# Patient Record
Sex: Male | Born: 1995 | Race: White | Hispanic: No | Marital: Single
Health system: Southern US, Community
[De-identification: ages and names within clinical notes are randomized; demographics above are authoritative.]

## PROBLEM LIST (undated history)

## (undated) DIAGNOSIS — J45909 Unspecified asthma, uncomplicated: Secondary | ICD-10-CM

## (undated) DIAGNOSIS — R569 Unspecified convulsions: Secondary | ICD-10-CM

---

## 2014-11-27 HISTORY — PX: OTHER SURGICAL HISTORY: SHX169

## 2019-01-06 ENCOUNTER — Emergency Department (HOSPITAL_COMMUNITY)
Admission: EM | Admit: 2019-01-06 | Discharge: 2019-01-07 | Disposition: A | Discharge status: Home / Self Care | Payer: PRIVATE HEALTH INSURANCE | Attending: Emergency Medicine | Admitting: Emergency Medicine

## 2019-01-06 ENCOUNTER — Emergency Department (HOSPITAL_COMMUNITY): Payer: PRIVATE HEALTH INSURANCE

## 2019-01-06 ENCOUNTER — Encounter (HOSPITAL_COMMUNITY): Payer: Self-pay | Admitting: Emergency Medicine

## 2019-01-06 DIAGNOSIS — L03213 Periorbital cellulitis: Secondary | ICD-10-CM | POA: Diagnosis not present

## 2019-01-06 DIAGNOSIS — H5789 Other specified disorders of eye and adnexa: Secondary | ICD-10-CM | POA: Diagnosis present

## 2019-01-06 LAB — CBC WITH DIFFERENTIAL/PLATELET
Abs Immature Granulocytes: 0.04 10*3/uL (ref 0.00–0.07)
Basophils Absolute: 0.1 10*3/uL (ref 0.0–0.1)
Basophils Relative: 0 %
Eosinophils Absolute: 0.1 10*3/uL (ref 0.0–0.5)
Eosinophils Relative: 1 %
HCT: 50.9 % (ref 39.0–52.0)
Hemoglobin: 16.9 g/dL (ref 13.0–17.0)
Immature Granulocytes: 0 %
LYMPHS ABS: 1.5 10*3/uL (ref 0.7–4.0)
LYMPHS PCT: 11 %
MCH: 28.4 pg (ref 26.0–34.0)
MCHC: 33.2 g/dL (ref 30.0–36.0)
MCV: 85.5 fL (ref 80.0–100.0)
Monocytes Absolute: 1 10*3/uL (ref 0.1–1.0)
Monocytes Relative: 7 %
Neutro Abs: 10.9 10*3/uL — ABNORMAL HIGH (ref 1.7–7.7)
Neutrophils Relative %: 81 %
PLATELETS: 264 10*3/uL (ref 150–400)
RBC: 5.95 MIL/uL — ABNORMAL HIGH (ref 4.22–5.81)
RDW: 11.9 % (ref 11.5–15.5)
WBC: 13.6 10*3/uL — ABNORMAL HIGH (ref 4.0–10.5)
nRBC: 0 % (ref 0.0–0.2)

## 2019-01-06 LAB — BASIC METABOLIC PANEL
Anion gap: 9 (ref 5–15)
BUN: <5 mg/dL — ABNORMAL LOW (ref 6–20)
CO2: 27 mmol/L (ref 22–32)
Calcium: 9.7 mg/dL (ref 8.9–10.3)
Chloride: 105 mmol/L (ref 98–111)
Creatinine, Ser: 1.01 mg/dL (ref 0.61–1.24)
GFR calc Af Amer: >60 mL/min (ref >60)
GFR calc non Af Amer: >60 mL/min (ref >60)
GLUCOSE: 114 mg/dL — AB (ref 70–99)
Potassium: 3.7 mmol/L (ref 3.5–5.1)
Sodium: 141 mmol/L (ref 135–145)

## 2019-01-06 MED ORDER — AMOXICILLIN-POT CLAVULANATE 875-125 MG PO TABS
1.0000 | ORAL_TABLET | Freq: Once | ORAL | Status: AC
  Administered 2019-01-06: 1 via ORAL
  Filled 2019-01-06: qty 1

## 2019-01-06 MED ORDER — AMOXICILLIN-POT CLAVULANATE 875-125 MG PO TABS: 1.0000 | ORAL_TABLET | Freq: Two times a day (BID) | ORAL | 0 refills | Status: AC

## 2019-01-06 MED ORDER — IOHEXOL 300 MG/ML  SOLN
75.0000 mL | Freq: Once | INTRAMUSCULAR | Status: AC | PRN
  Administered 2019-01-06: 75 mL via INTRAVENOUS

## 2019-01-06 MED ORDER — CLINDAMYCIN HCL 150 MG PO CAPS
300.0000 mg | ORAL_CAPSULE | Freq: Once | ORAL | Status: AC
  Administered 2019-01-06: 300 mg via ORAL
  Filled 2019-01-06: qty 2

## 2019-01-06 MED ORDER — TETRACAINE HCL 0.5 % OP SOLN
2.0000 [drp] | Freq: Once | OPHTHALMIC | Status: AC
  Administered 2019-01-06: 2 [drp] via OPHTHALMIC
  Filled 2019-01-06: qty 4

## 2019-01-06 MED ORDER — FLUORESCEIN SODIUM 1 MG OP STRP
1.0000 | ORAL_STRIP | Freq: Once | OPHTHALMIC | Status: AC
  Administered 2019-01-06: 1 via OPHTHALMIC
  Filled 2019-01-06: qty 1

## 2019-01-06 MED ORDER — CLINDAMYCIN HCL 300 MG PO CAPS: 300.0000 mg | ORAL_CAPSULE | Freq: Three times a day (TID) | ORAL | 0 refills | Status: AC

## 2019-01-06 NOTE — Discharge Instructions (Signed)
You can take Tylenol or Ibuprofen as directed for pain. You can alternate Tylenol and Ibuprofen every 4 hours. If you take Tylenol at 1pm, then you can take Ibuprofen at 5pm. Then you can take Tylenol again at 9pm.   Take antibiotics as directed. Please take all of your antibiotics until finished.  As we discussed, it is very important for you to follow-up with eye doctor.  Please call Dr. Benard Rink office tomorrow and arrange for an appointment.  Additionally, given your extensive history, it is important that you follow-up with the ear nose and throat doctor.  Please call Dr. Thurmon Fair office tomorrow and arrange for an appointment.  If you have worsening redness or swelling that begins to spread, your pain in your eye gets worse, you start running fever, you have no improvement after 2 days of antibiotics, return the emergency department immediately.
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
## 2019-01-06 NOTE — ED Triage Notes (Signed)
Pt reports that he woke up with redness, swelling, and drainage to his right eye. Denies any occular trauma.

## 2019-01-06 NOTE — ED Notes (Signed)
Visual Acuity L Eye- 10/12.5 R Eye-10/20 Both- 10/10

## 2019-01-06 NOTE — ED Notes (Signed)
No answer in waiting room 

## 2019-02-24 ENCOUNTER — Encounter (HOSPITAL_COMMUNITY): Payer: Self-pay

## 2019-02-24 ENCOUNTER — Emergency Department (HOSPITAL_COMMUNITY)
Admission: EM | Admit: 2019-02-24 | Discharge: 2019-02-25 | Disposition: A | Discharge status: Home / Self Care | Payer: PRIVATE HEALTH INSURANCE | Attending: Emergency Medicine | Admitting: Emergency Medicine

## 2019-02-24 ENCOUNTER — Other Ambulatory Visit: Payer: Self-pay

## 2019-02-24 ENCOUNTER — Emergency Department (HOSPITAL_COMMUNITY): Payer: PRIVATE HEALTH INSURANCE

## 2019-02-24 DIAGNOSIS — S0101XA Laceration without foreign body of scalp, initial encounter: Secondary | ICD-10-CM | POA: Insufficient documentation

## 2019-02-24 DIAGNOSIS — R569 Unspecified convulsions: Secondary | ICD-10-CM | POA: Diagnosis not present

## 2019-02-24 DIAGNOSIS — Y998 Other external cause status: Secondary | ICD-10-CM | POA: Diagnosis not present

## 2019-02-24 DIAGNOSIS — J45909 Unspecified asthma, uncomplicated: Secondary | ICD-10-CM | POA: Diagnosis not present

## 2019-02-24 DIAGNOSIS — Z23 Encounter for immunization: Secondary | ICD-10-CM | POA: Diagnosis not present

## 2019-02-24 DIAGNOSIS — Y92410 Unspecified street and highway as the place of occurrence of the external cause: Secondary | ICD-10-CM | POA: Insufficient documentation

## 2019-02-24 DIAGNOSIS — Y9389 Activity, other specified: Secondary | ICD-10-CM | POA: Diagnosis not present

## 2019-02-24 DIAGNOSIS — Y33XXXA Other specified events, undetermined intent, initial encounter: Secondary | ICD-10-CM | POA: Diagnosis not present

## 2019-02-24 HISTORY — DX: Unspecified convulsions: R56.9

## 2019-02-24 HISTORY — DX: Unspecified asthma, uncomplicated: J45.909

## 2019-02-24 LAB — CBC WITH DIFFERENTIAL/PLATELET
Abs Immature Granulocytes: 0.06 10*3/uL (ref 0.00–0.07)
Basophils Absolute: 0.1 10*3/uL (ref 0.0–0.1)
Basophils Relative: 1 %
Eosinophils Absolute: 0.1 10*3/uL (ref 0.0–0.5)
Eosinophils Relative: 1 %
HCT: 49 % (ref 39.0–52.0)
Hemoglobin: 16.2 g/dL (ref 13.0–17.0)
IMMATURE GRANULOCYTES: 1 %
Lymphocytes Relative: 24 %
Lymphs Abs: 2.3 10*3/uL (ref 0.7–4.0)
MCH: 28 pg (ref 26.0–34.0)
MCHC: 33.1 g/dL (ref 30.0–36.0)
MCV: 84.6 fL (ref 80.0–100.0)
Monocytes Absolute: 0.5 10*3/uL (ref 0.1–1.0)
Monocytes Relative: 5 %
NEUTROS PCT: 68 %
NRBC: 0 % (ref 0.0–0.2)
Neutro Abs: 6.4 10*3/uL (ref 1.7–7.7)
Platelets: 291 10*3/uL (ref 150–400)
RBC: 5.79 MIL/uL (ref 4.22–5.81)
RDW: 12.1 % (ref 11.5–15.5)
WBC: 9.4 10*3/uL (ref 4.0–10.5)

## 2019-02-24 LAB — CBG MONITORING, ED: Glucose-Capillary: 101 mg/dL — ABNORMAL HIGH (ref 70–99)

## 2019-02-24 MED ORDER — LEVETIRACETAM IN NACL 1000 MG/100ML IV SOLN
1000.0000 mg | Freq: Once | INTRAVENOUS | Status: AC
  Administered 2019-02-25: 1000 mg via INTRAVENOUS
  Filled 2019-02-24: qty 100

## 2019-02-24 MED ORDER — THIAMINE HCL 100 MG/ML IJ SOLN
100.0000 mg | Freq: Once | INTRAMUSCULAR | Status: AC
  Administered 2019-02-25: 100 mg via INTRAVENOUS
  Filled 2019-02-24: qty 2

## 2019-02-24 MED ORDER — TETANUS-DIPHTH-ACELL PERTUSSIS 5-2.5-18.5 LF-MCG/0.5 IM SUSP
0.5000 mL | Freq: Once | INTRAMUSCULAR | Status: AC
  Administered 2019-02-25: 0.5 mL via INTRAMUSCULAR
  Filled 2019-02-24: qty 0.5
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
## 2019-02-24 NOTE — ED Notes (Signed)
ED Provider at bedside. 
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
## 2019-02-25 LAB — RAPID URINE DRUG SCREEN, HOSP PERFORMED
Amphetamines: NOT DETECTED
Barbiturates: NOT DETECTED
Benzodiazepines: NOT DETECTED
Cocaine: NOT DETECTED
Opiates: NOT DETECTED
Tetrahydrocannabinol: NOT DETECTED

## 2019-02-25 LAB — COMPREHENSIVE METABOLIC PANEL
ALT: 15 U/L (ref 0–44)
AST: 20 U/L (ref 15–41)
Albumin: 4.3 g/dL (ref 3.5–5.0)
Alkaline Phosphatase: 59 U/L (ref 38–126)
Anion gap: 13 (ref 5–15)
BUN: 12 mg/dL (ref 6–20)
CO2: 19 mmol/L — ABNORMAL LOW (ref 22–32)
Calcium: 9.4 mg/dL (ref 8.9–10.3)
Chloride: 104 mmol/L (ref 98–111)
Creatinine, Ser: 0.93 mg/dL (ref 0.61–1.24)
GFR calc Af Amer: >60 mL/min (ref >60)
Glucose, Bld: 103 mg/dL — ABNORMAL HIGH (ref 70–99)
Potassium: 3.5 mmol/L (ref 3.5–5.1)
Sodium: 136 mmol/L (ref 135–145)
Total Bilirubin: 1.2 mg/dL (ref 0.3–1.2)
Total Protein: 7.8 g/dL (ref 6.5–8.1)

## 2019-02-25 LAB — ETHANOL: Alcohol, Ethyl (B): <10 mg/dL (ref <10)

## 2019-02-25 MED ORDER — IBUPROFEN 400 MG PO TABS
600.0000 mg | ORAL_TABLET | Freq: Once | ORAL | Status: AC
  Administered 2019-02-25: 600 mg via ORAL
  Filled 2019-02-25: qty 1

## 2019-02-25 MED ORDER — LEVETIRACETAM 500 MG PO TABS: 500.0000 mg | ORAL_TABLET | Freq: Every day | ORAL | 0 refills | Status: DC

## 2019-02-25 NOTE — ED Notes (Signed)
Patient verbalizes understanding of discharge instructions. Opportunity for questioning and answers were provided. Armband removed by staff, pt discharged from ED. Ambulated out to lobby  

## 2019-02-25 NOTE — Discharge Instructions (Addendum)
Thank you for allowing me to care for you today in the Emergency Department.   I have given you a prescription for Keppra for the next 30 days.  You can follow-up with Clerance Lav at the [facility] to have this medication filled.  As we discussed, you did not want to have any additional staples or other repair performed to the wound on the back of your head.  You did allow me to place 4 staples before you refused anymore.  These need to be removed in 10 days.  You can return to the ER, go to urgent care, or follow-up with Clerance Lav to have the staples removed.  Since he did allow me to place glue on the area, this will likely fall out in the next few days.  Right now, it is keeping the area from bleeding since she did not allow me to place staples.  Try to avoid pulling it out until it falls off naturally.  You can take Tylenol or ibuprofen for pain control.  Do not take Tylenol if you are drinking alcohol.  If you decide that you would like help with stopping to drink alcohol, resources are attached along with your discharge paperwork.  Wound care instructions for wound closed with glue attached.   When closed properly, wounds on the head have low infection rates, but since she did not allow me to finish closing the wound there is a higher risk for infection.  You should return to the ER if you develop fever, chills, if the area gets red, hot to the touch with significantly worsening pain or if you develop thick, mucus-like drainage from the area as these are all signs of infection.

## 2019-02-27 LAB — LEVETIRACETAM LEVEL: Levetiracetam Lvl: <1 ug/mL — ABNORMAL LOW (ref 10.0–40.0)

## 2019-03-12 ENCOUNTER — Encounter (HOSPITAL_COMMUNITY): Payer: Self-pay | Admitting: *Deleted

## 2019-03-12 ENCOUNTER — Emergency Department (HOSPITAL_COMMUNITY)
Admission: EM | Admit: 2019-03-12 | Discharge: 2019-03-12 | Disposition: A | Discharge status: Home / Self Care | Payer: PRIVATE HEALTH INSURANCE | Attending: Emergency Medicine | Admitting: Emergency Medicine

## 2019-03-12 DIAGNOSIS — Z4802 Encounter for removal of sutures: Secondary | ICD-10-CM | POA: Insufficient documentation

## 2019-03-12 MED ORDER — LEVETIRACETAM 500 MG PO TABS: 500.0000 mg | ORAL_TABLET | Freq: Every day | ORAL | 0 refills | Status: AC

## 2019-03-12 NOTE — Discharge Instructions (Signed)
Please read attached information. If you experience any new or worsening signs or symptoms please return to the emergency room for evaluation. Please follow-up with your primary care provider or specialist as discussed.  °

## 2019-03-12 NOTE — ED Triage Notes (Signed)
Pt in for staple removal from back of his head, placed at end of March, no distress noted

## 2019-03-12 NOTE — ED Notes (Signed)
Patient verbalizes understanding of discharge instructions. Opportunity for questioning and answers were provided. Armband removed by staff, pt discharged from ED. Pt ambulatory to lobby.  
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
## 2019-03-24 ENCOUNTER — Emergency Department (HOSPITAL_COMMUNITY)
Admission: EM | Admit: 2019-03-24 | Discharge: 2019-03-25 | Disposition: A | Discharge status: Home / Self Care | Payer: Medicaid - Out of State | Attending: Emergency Medicine | Admitting: Emergency Medicine

## 2019-03-24 ENCOUNTER — Other Ambulatory Visit: Payer: Self-pay

## 2019-03-24 ENCOUNTER — Emergency Department (HOSPITAL_COMMUNITY): Payer: Medicaid - Out of State

## 2019-03-24 DIAGNOSIS — Z79899 Other long term (current) drug therapy: Secondary | ICD-10-CM | POA: Diagnosis not present

## 2019-03-24 DIAGNOSIS — R112 Nausea with vomiting, unspecified: Secondary | ICD-10-CM | POA: Diagnosis not present

## 2019-03-24 DIAGNOSIS — R51 Headache: Secondary | ICD-10-CM | POA: Diagnosis not present

## 2019-03-24 DIAGNOSIS — Z8709 Personal history of other diseases of the respiratory system: Secondary | ICD-10-CM | POA: Diagnosis not present

## 2019-03-24 DIAGNOSIS — R519 Headache, unspecified: Secondary | ICD-10-CM

## 2019-03-24 DIAGNOSIS — K226 Gastro-esophageal laceration-hemorrhage syndrome: Secondary | ICD-10-CM

## 2019-03-24 LAB — COMPREHENSIVE METABOLIC PANEL
ALT: 16 U/L (ref 0–44)
AST: 19 U/L (ref 15–41)
Albumin: 4.6 g/dL (ref 3.5–5.0)
Alkaline Phosphatase: 39 U/L (ref 38–126)
Anion gap: 12 (ref 5–15)
BUN: 10 mg/dL (ref 6–20)
CO2: 21 mmol/L — ABNORMAL LOW (ref 22–32)
Calcium: 9.7 mg/dL (ref 8.9–10.3)
Chloride: 108 mmol/L (ref 98–111)
Creatinine, Ser: 0.92 mg/dL (ref 0.61–1.24)
GFR calc Af Amer: >60 mL/min (ref >60)
GFR calc non Af Amer: >60 mL/min (ref >60)
Glucose, Bld: 115 mg/dL — ABNORMAL HIGH (ref 70–99)
Potassium: 3.9 mmol/L (ref 3.5–5.1)
Sodium: 141 mmol/L (ref 135–145)
Total Bilirubin: 0.9 mg/dL (ref 0.3–1.2)
Total Protein: 7.6 g/dL (ref 6.5–8.1)

## 2019-03-24 LAB — URINALYSIS, ROUTINE W REFLEX MICROSCOPIC
Bacteria, UA: NONE SEEN
Bilirubin Urine: NEGATIVE
Glucose, UA: NEGATIVE mg/dL
Hgb urine dipstick: NEGATIVE
Ketones, ur: NEGATIVE mg/dL
Leukocytes,Ua: NEGATIVE
Nitrite: NEGATIVE
Protein, ur: NEGATIVE mg/dL
Specific Gravity, Urine: 1.015 (ref 1.005–1.030)
pH: 5 (ref 5.0–8.0)

## 2019-03-24 LAB — CBC
HCT: 45.4 % (ref 39.0–52.0)
Hemoglobin: 15.8 g/dL (ref 13.0–17.0)
MCH: 29.5 pg (ref 26.0–34.0)
MCHC: 34.8 g/dL (ref 30.0–36.0)
MCV: 84.9 fL (ref 80.0–100.0)
Platelets: 287 10*3/uL (ref 150–400)
RBC: 5.35 MIL/uL (ref 4.22–5.81)
RDW: 12.2 % (ref 11.5–15.5)
WBC: 16 10*3/uL — ABNORMAL HIGH (ref 4.0–10.5)
nRBC: 0 % (ref 0.0–0.2)

## 2019-03-24 LAB — LIPASE, BLOOD: Lipase: 28 U/L (ref 11–51)

## 2019-03-24 MED ORDER — DIPHENHYDRAMINE HCL 50 MG/ML IJ SOLN
25.0000 mg | Freq: Once | INTRAMUSCULAR | Status: AC
  Administered 2019-03-25: 25 mg via INTRAVENOUS
  Filled 2019-03-24: qty 1

## 2019-03-24 MED ORDER — PROCHLORPERAZINE EDISYLATE 10 MG/2ML IJ SOLN
10.0000 mg | Freq: Once | INTRAMUSCULAR | Status: AC
  Administered 2019-03-25: 10 mg via INTRAVENOUS
  Filled 2019-03-24: qty 2

## 2019-03-24 MED ORDER — ONDANSETRON 4 MG PO TBDP
4.0000 mg | ORAL_TABLET | Freq: Once | ORAL | Status: AC | PRN
  Administered 2019-03-24: 20:00:00 4 mg via ORAL
  Filled 2019-03-24: qty 1

## 2019-03-24 NOTE — ED Notes (Signed)
Urine culture save tube sent with urine specimen.  
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
## 2019-03-24 NOTE — ED Triage Notes (Signed)
Patient states that he has been vomiting blood x 5 times. This nurse witnessed emesis in a container and did not see any blood at this time.

## 2019-03-25 ENCOUNTER — Emergency Department (HOSPITAL_COMMUNITY): Payer: Medicaid - Out of State

## 2019-03-25 MED ORDER — ONDANSETRON HCL 4 MG/2ML IJ SOLN
4.0000 mg | Freq: Once | INTRAMUSCULAR | Status: AC
  Administered 2019-03-25: 02:00:00 4 mg via INTRAVENOUS
  Filled 2019-03-25: qty 2

## 2019-03-25 MED ORDER — SODIUM CHLORIDE 0.9 % IV BOLUS
1000.0000 mL | Freq: Once | INTRAVENOUS | Status: AC
  Administered 2019-03-25: 02:00:00 1000 mL via INTRAVENOUS

## 2019-03-25 MED ORDER — ONDANSETRON 4 MG PO TBDP: 4.0000 mg | ORAL_TABLET | Freq: Three times a day (TID) | ORAL | 0 refills | Status: AC | PRN

## 2019-03-25 MED ORDER — MORPHINE SULFATE (PF) 4 MG/ML IV SOLN
4.0000 mg | Freq: Once | INTRAVENOUS | Status: DC
  Filled 2019-03-25: qty 1

## 2019-03-25 NOTE — ED Notes (Signed)
Patient transported to CT 
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
